# Patient Record
Sex: Male | Born: 1995 | Race: Black or African American | Hispanic: No | Marital: Single | Smoking: Current every day smoker
Health system: Southern US, Community
[De-identification: ages and names within clinical notes are randomized; demographics above are authoritative.]

---
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
## 2016-06-07 ENCOUNTER — Ambulatory Visit (HOSPITAL_COMMUNITY)
Admission: EM | Admit: 2016-06-07 | Discharge: 2016-06-07 | Disposition: A | Discharge status: Nursing Home (Includes ICF) | Payer: Medicaid Other | Attending: Internal Medicine | Admitting: Internal Medicine

## 2016-06-07 ENCOUNTER — Emergency Department (HOSPITAL_COMMUNITY)
Admission: EM | Admit: 2016-06-07 | Discharge: 2016-06-08 | Disposition: A | Discharge status: Home / Self Care | Payer: Self-pay | Attending: Emergency Medicine | Admitting: Emergency Medicine

## 2016-06-07 ENCOUNTER — Encounter (HOSPITAL_COMMUNITY): Payer: Self-pay | Admitting: Emergency Medicine

## 2016-06-07 ENCOUNTER — Emergency Department (HOSPITAL_COMMUNITY): Payer: Medicaid Other

## 2016-06-07 DIAGNOSIS — Y929 Unspecified place or not applicable: Secondary | ICD-10-CM | POA: Insufficient documentation

## 2016-06-07 DIAGNOSIS — R52 Pain, unspecified: Secondary | ICD-10-CM

## 2016-06-07 DIAGNOSIS — S3994XA Unspecified injury of external genitals, initial encounter: Secondary | ICD-10-CM

## 2016-06-07 DIAGNOSIS — Y939 Activity, unspecified: Secondary | ICD-10-CM | POA: Insufficient documentation

## 2016-06-07 DIAGNOSIS — F17210 Nicotine dependence, cigarettes, uncomplicated: Secondary | ICD-10-CM | POA: Insufficient documentation

## 2016-06-07 DIAGNOSIS — F909 Attention-deficit hyperactivity disorder, unspecified type: Secondary | ICD-10-CM | POA: Insufficient documentation

## 2016-06-07 DIAGNOSIS — N5089 Other specified disorders of the male genital organs: Secondary | ICD-10-CM

## 2016-06-07 DIAGNOSIS — Y999 Unspecified external cause status: Secondary | ICD-10-CM | POA: Insufficient documentation

## 2016-06-07 DIAGNOSIS — X58XXXA Exposure to other specified factors, initial encounter: Secondary | ICD-10-CM | POA: Insufficient documentation

## 2016-06-07 DIAGNOSIS — S3022XA Contusion of scrotum and testes, initial encounter: Secondary | ICD-10-CM | POA: Insufficient documentation

## 2016-06-07 LAB — CBC WITH DIFFERENTIAL/PLATELET
BASOS ABS: 0 10*3/uL (ref 0.0–0.1)
BASOS PCT: 0 %
EOS PCT: 0 %
Eosinophils Absolute: 0 10*3/uL (ref 0.0–0.7)
HCT: 39.4 % (ref 39.0–52.0)
Hemoglobin: 13.3 g/dL (ref 13.0–17.0)
Lymphocytes Relative: 15 %
Lymphs Abs: 1.8 10*3/uL (ref 0.7–4.0)
MCH: 29.6 pg (ref 26.0–34.0)
MCHC: 33.8 g/dL (ref 30.0–36.0)
MCV: 87.8 fL (ref 78.0–100.0)
MONO ABS: 0.7 10*3/uL (ref 0.1–1.0)
Monocytes Relative: 6 %
Neutro Abs: 9.7 10*3/uL — ABNORMAL HIGH (ref 1.7–7.7)
Neutrophils Relative %: 79 %
PLATELETS: 365 10*3/uL (ref 150–400)
RBC: 4.49 MIL/uL (ref 4.22–5.81)
RDW: 12.1 % (ref 11.5–15.5)
WBC: 12.2 10*3/uL — ABNORMAL HIGH (ref 4.0–10.5)

## 2016-06-07 LAB — COMPREHENSIVE METABOLIC PANEL
ALBUMIN: 4.3 g/dL (ref 3.5–5.0)
ALT: 21 U/L (ref 17–63)
ANION GAP: 10 (ref 5–15)
AST: 22 U/L (ref 15–41)
Alkaline Phosphatase: 56 U/L (ref 38–126)
BUN: 11 mg/dL (ref 6–20)
CHLORIDE: 105 mmol/L (ref 101–111)
CO2: 24 mmol/L (ref 22–32)
Calcium: 9.8 mg/dL (ref 8.9–10.3)
Creatinine, Ser: 0.96 mg/dL (ref 0.61–1.24)
GFR calc Af Amer: >60 mL/min (ref >60)
GLUCOSE: 82 mg/dL (ref 65–99)
POTASSIUM: 3.5 mmol/L (ref 3.5–5.1)
Sodium: 139 mmol/L (ref 135–145)
Total Bilirubin: 0.7 mg/dL (ref 0.3–1.2)
Total Protein: 7.3 g/dL (ref 6.5–8.1)

## 2016-06-07 MED ORDER — MORPHINE SULFATE (PF) 4 MG/ML IV SOLN: 4.0000 mg | Freq: Once | INTRAVENOUS | Status: DC

## 2016-06-07 MED ORDER — HYDROCODONE-ACETAMINOPHEN 5-325 MG PO TABS
1.0000 | ORAL_TABLET | Freq: Once | ORAL | Status: AC
  Administered 2016-06-07: 1 via ORAL
  Filled 2016-06-07: qty 1

## 2016-06-07 NOTE — ED Notes (Signed)
Pt declined STD check.... Notified Dr. Lum BabeEniola

## 2016-06-07 NOTE — Discharge Instructions (Signed)
It was nice seeing you. I am sorry about your scrotal injury. I will recommend and ultrasound which we don't do here. Please go to the ED right away. Use Tylenol as needed for pain.

## 2016-06-07 NOTE — ED Provider Notes (Signed)
[facility] Provider Note   CSN: [id number] Arrival date & time: 06/07/16  2055  History   Chief Complaint Chief Complaint  Patient presents with  . Groin Swelling   HPI Austin Olson is a 20 y.o. male.   Testicle Pain  This is a new problem. The current episode started more than 2 days ago (4). The problem occurs constantly. The problem has been gradually worsening. Nothing aggravates the symptoms. Nothing relieves the symptoms. He has tried nothing for the symptoms.   History reviewed. No pertinent past medical history.  Patient Active Problem List   Diagnosis Date Noted  . Screen for STD (sexually transmitted disease) 07/25/2014  . Attention deficit hyperactivity disorder (ADHD) 02/04/2010  . 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 ALLERGIC RHINITIS 11/05/2006  . 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 No pertinent surgical history.   Home Medications    Prior to Admission medications   Not on File    Family History Family History  Problem Relation Age of Onset  . Diabetes Father   . Asthma Sister   . Hypertension Neg Hx   . Heart disease Neg Hx    Social History Social History  Substance Use Topics  . Smoking status: Current Every Day Smoker    Types: Cigarettes  . Smokeless tobacco: Never Used  . Alcohol use Yes   Allergies   Patient has no known allergies.  Review of Systems Review of Systems  Constitutional: Negative for fatigue and fever.  Genitourinary: Positive for penile pain, penile swelling, scrotal swelling and testicular pain. Negative for discharge, dysuria, flank pain and genital sores.  All other systems reviewed and are negative.  Physical Exam Updated Vital Signs BP 132/76 (BP Location: Right Arm)   Pulse 100   Temp 98.1 F (36.7 C) (Oral)   Resp 18   Ht 5\' 11"  (1.803 m)   Wt 95.3 kg   SpO2 98%   BMI 29.29 kg/m   Physical Exam  Constitutional: He is oriented to person, place, and time. He appears  well-developed and well-nourished. No distress.  HENT:  Head: Normocephalic.  Eyes: Pupils are equal, round, and reactive to light.  Neck: Normal range of motion.  Cardiovascular: Normal rate.   Pulmonary/Chest: Effort normal. No respiratory distress. He has no wheezes.  Abdominal: Soft. He exhibits no distension. There is no tenderness. There is no guarding.  Genitourinary:  Genitourinary Comments: Scrotal swelling L/R with discoloration.   No testicular tenderness. Cremasteric reflex intact.   No penile tenderness.   Musculoskeletal: Normal range of motion. He exhibits no edema.  Neurological: He is alert and oriented to person, place, and time. He displays normal reflexes. No cranial nerve deficit. He exhibits normal muscle tone. Coordination normal.  Skin: Skin is warm. Capillary refill takes less than 2 seconds. He is not diaphoretic.  Psychiatric: He has a normal mood and affect. His behavior is normal.  Nursing note and vitals reviewed.    ED Treatments / Results  Labs (all labs ordered are listed, but only abnormal results are displayed) Labs Reviewed  CBC WITH DIFFERENTIAL/PLATELET - Abnormal; Notable for the following:       Result Value   WBC 12.2 (*)    Neutro Abs 9.7 (*)    All other components within normal limits  COMPREHENSIVE METABOLIC PANEL  URINALYSIS, ROUTINE W REFLEX MICROSCOPIC (NOT AT [facility])    EKG  EKG Interpretation None       Radiology US Scrotum  Result Date: 06/07/2016  CLINICAL DATA:  Trauma to the penis during intercourse. Penile pain and scrotal swelling. Symptoms began 5 days ago. EXAM: SCROTAL ULTRASOUND DOPPLER ULTRASOUND OF THE TESTICLES TECHNIQUE: Complete ultrasound examination of the testicles, epididymis, and other scrotal structures was performed. Color and spectral Doppler ultrasound were also utilized to evaluate blood flow to the testicles. COMPARISON:  None. FINDINGS: Right testicle Measurements: 4 x 2.2 x 2.8 cm. Normal  testicular parenchymal echotexture. Tiny punctate calcification noted. This is likely of no clinical significance. Left testicle Measurements: 3.4 x 2.1 x 2.9 cm. No mass or microlithiasis visualized. Right epididymis: Mild prominence of the tail of the epididymis. No focal lesions identified. Left epididymis:  Normal in size and appearance. Hydrocele:  None visualized. Varicocele:  None visualized. Pulsed Doppler interrogation of both testes demonstrates normal low resistance arterial and venous waveforms bilaterally. Normal homogeneous and symmetrical flow is demonstrated to both testes on color flow Doppler imaging. Prominent diffuse scrotal soft tissue swelling could indicate subcutaneous edema or hematoma. No focal lesion is visualized. IMPRESSION: Normal sound appearance of the testicles. No evidence of testicular mass, torsion, or inflammation. Diffuse scrotal soft tissue swelling. Electronically Signed   By: Burman NievesWilliam  Stevens M.D.   On: 06/07/2016 23:06   [place] Art/ven Flow Abd Pelv Doppler  Result Date: 06/07/2016 CLINICAL DATA:  Trauma to the penis during intercourse. Penile pain and scrotal swelling. Symptoms began 5 days ago. EXAM: SCROTAL ULTRASOUND DOPPLER ULTRASOUND OF THE TESTICLES TECHNIQUE: Complete ultrasound examination of the testicles, epididymis, and other scrotal structures was performed. Color and spectral Doppler ultrasound were also utilized to evaluate blood flow to the testicles. COMPARISON:  None. FINDINGS: Right testicle Measurements: 4 x 2.2 x 2.8 cm. Normal testicular parenchymal echotexture. Tiny punctate calcification noted. This is likely of no clinical significance. Left testicle Measurements: 3.4 x 2.1 x 2.9 cm. No mass or microlithiasis visualized. Right epididymis: Mild prominence of the tail of the epididymis. No focal lesions identified. Left epididymis:  Normal in size and appearance. Hydrocele:  None visualized. Varicocele:  None visualized. Pulsed Doppler interrogation  of both testes demonstrates normal low resistance arterial and venous waveforms bilaterally. Normal homogeneous and symmetrical flow is demonstrated to both testes on color flow Doppler imaging. Prominent diffuse scrotal soft tissue swelling could indicate subcutaneous edema or hematoma. No focal lesion is visualized. IMPRESSION: Normal sound appearance of the testicles. No evidence of testicular mass, torsion, or inflammation. Diffuse scrotal soft tissue swelling. Electronically Signed   By: Burman NievesWilliam  Stevens M.D.   On: 06/07/2016 23:06    Procedures Procedures (including critical care time)  Medications Ordered in ED Medications  morphine 4 MG/ML injection 4 mg (not administered)     Initial Impression / Assessment and Plan / ED Course  I have reviewed the triage vital signs and the nursing notes.  Pertinent labs & imaging results that were available during my care of the patient were reviewed by me and considered in my medical decision making (see chart for details).  Clinical Course    Patient is a pleasant 20 year old male who presents to emergency department today with concerns of scrotal swelling. Approximate 4 days ago during intercourse, patient had trauma to his penis. He had immediate swelling to his scrotal area that subsequently went to the base of his penis. Since that time patient's pain has slowly improved as well as swelling has decreased. He went to urgent care today who recommended ultrasound.  Physical exam is consistent with hematoma, patient has more likely not had any  fracture of his shaft as he has had intercourse, degeneration and injected without any pain since his initial injury.  Patient has no concerns or signs or symptoms of STDs. He has no dysuria. He has no history of the same.  Patient has nose signs or symptoms associated torsion or epididymitis.  Ultrasound was obtained which showed scrotal swelling but no other additional abnormalities. This is more than  likely hematoma of his scrotum originating at the base of his penis. Patient will follow up with urology for further evaluation and management.  Patient discharged home in good health.  Final Clinical Impressions(s) / ED Diagnoses   Final diagnoses:  Traumatic scrotal hematoma, initial encounter    New Prescriptions New Prescriptions   No medications on file     Deirdre PeerJeremiah Sheridan Gettel, MD 06/08/16 0123    Charlynne Panderavid Hsienta Yao, MD 06/08/16 1511

## 2016-06-07 NOTE — ED Provider Notes (Addendum)
[facility]    CSN: [id number] Arrival date & time: 06/07/16  1938     History   Chief Complaint Chief Complaint  Patient presents with  . Penis Pain    HPI Austin Olson is a 20 y.o. male.   The history is provided by the patient. No language interpreter was used.  Penile injury: During intercourse 5 days ago, she missed the vagina orifice and his penis hit his partner's pelvic bone. Initially he was having pain on standing and walking. Pain is located mostly around his testicle, no penile pain, there is associated swelling around his scrotal area with bruising. No discharge. He uses condoms occasional. He is currently engaged to be married to same partner for 7 months. Denies dysuria,he is able to urinate and ejaculate fine. He has not had sex since then.  History reviewed. No pertinent past medical history.  Patient Active Problem List   Diagnosis Date Noted  . Screen for STD (sexually transmitted disease) 07/25/2014  . Attention deficit hyperactivity disorder (ADHD) 02/04/2010  . 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 ALLERGIC RHINITIS 11/05/2006  . 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 No pertinent surgical history.     Home Medications    Prior to Admission medications   Not on File    Family History Family History  Problem Relation Age of Onset  . Diabetes Father   . Asthma Sister   . Hypertension Neg Hx   . Heart disease Neg Hx     Social History Social History  Substance Use Topics  . Smoking status: Current Every Day Smoker    Types: Cigarettes  . Smokeless tobacco: Never Used  . Alcohol use Yes     Allergies   Patient has no known allergies.   Review of Systems Review of Systems  Constitutional: Negative.   Respiratory: Negative.   Cardiovascular: Negative.   Genitourinary: Positive for scrotal swelling. Negative for difficulty urinating, discharge, enuresis and frequency.       Scrotal pain  All other  systems reviewed and are negative.    Physical Exam Triage Vital Signs ED Triage Vitals  Enc Vitals Group     BP 06/07/16 1957 130/76     Pulse Rate 06/07/16 1957 114     Resp 06/07/16 1957 16     Temp 06/07/16 1957 99.4 F (37.4 C)     Temp Source 06/07/16 1957 Oral     SpO2 06/07/16 1957 100 %     Weight --      Height --      Head Circumference --      Peak Flow --      Pain Score 06/07/16 1955 8     Pain Loc --      Pain Edu? --      Excl. in GC? --    No data found.   Updated Vital Signs BP 130/76 (BP Location: Left Arm)   Pulse 114   Temp 99.4 F (37.4 C) (Oral)   Resp 16   SpO2 100%   Visual Acuity Right Eye Distance:   Left Eye Distance:   Bilateral Distance:    Right Eye Near:   Left Eye Near:    Bilateral Near:     Physical Exam  Constitutional: He appears well-developed and well-nourished. No distress.  Cardiovascular: Normal rate, regular rhythm and normal heart sounds.   No murmur heard. Pulmonary/Chest: Effort normal and breath sounds normal. No respiratory distress. He has no  wheezes.  Abdominal: Soft. He exhibits no distension. There is no tenderness.  Genitourinary: Circumcised.     Nursing note and vitals reviewed.    UC Treatments / Results  Labs (all labs ordered are listed, but only abnormal results are displayed) Labs Reviewed  URINALYSIS, ROUTINE W REFLEX MICROSCOPIC (NOT AT [id number])  URINE CYTOLOGY ANCILLARY ONLY    EKG  EKG Interpretation None       Radiology No results found.  Procedures Procedures (including critical care time)  Medications Ordered in UC Medications - No data to display   Initial Impression / Assessment and Plan / UC Course  I have reviewed the triage vital signs and the nursing notes.  Pertinent labs & imaging results that were available during my care of the patient were reviewed by me and considered in my medical decision making (see chart for details).  Clinical Course     Massive  scrotal swelling. Likely hematocele I recommended scrotal U/S. Patient advised to go to the ED right away for ultrasound and possibly urology evaluation. HIV, RPR, Urine GC/Chlamydia checked given hx of unprotected sexual activity F/U with PCP for routine health care after ED visit. He agreed with plan.  Final Clinical Impressions(s) / UC Diagnoses   Final diagnoses:  None  Scrotal swelling  Injury to scrotum, initial encounter    New Prescriptions New Prescriptions   No medications on file     Doreene ElandKehinde T Anthone Prieur, MD 06/07/16 [id number]    Doreene ElandKehinde T Marquia Costello, MD 06/07/16 2147

## 2016-06-07 NOTE — ED Triage Notes (Addendum)
Few days ago had trauma to the penis during intercourse (a forceful thrust that missed its target and felt like he jammed it). Since then reports pain to the penis that radiates underneath the scrotum and he reports his scrotum is swollen.

## 2016-06-07 NOTE — ED Triage Notes (Signed)
Pt reports pain to shaft of penis onset 3 days associated w/bruising, swelling   Reports he "jammed" penis while having sexual intercourse  Denies urinary sx, or trouble getting an erection  Steady gait... A&O x4... NAD

## 2016-06-07 NOTE — ED Notes (Signed)
Patient unable to provide urine specimen

## 2016-06-08 NOTE — ED Notes (Signed)
Pt given d/c summary with instructions to f/u with urology per EDP. Pt had no questions nor concerns upon d/c. Ambulated to lobby after signing. VSS.

## 2016-06-08 NOTE — ED Notes (Signed)
Pt is aware that a urine sample is needed pt is unable to urinate at this time

## 2016-12-09 ENCOUNTER — Encounter: Payer: Self-pay | Admitting: Family Medicine

## 2016-12-10 ENCOUNTER — Encounter: Payer: Self-pay | Admitting: Family Medicine

## 2017-12-04 ENCOUNTER — Emergency Department (HOSPITAL_BASED_OUTPATIENT_CLINIC_OR_DEPARTMENT_OTHER)
Admission: EM | Admit: 2017-12-04 | Discharge: 2017-12-05 | Disposition: A | Discharge status: Home / Self Care | Payer: Self-pay | Attending: Emergency Medicine | Admitting: Emergency Medicine

## 2017-12-04 ENCOUNTER — Emergency Department (HOSPITAL_BASED_OUTPATIENT_CLINIC_OR_DEPARTMENT_OTHER): Payer: Self-pay

## 2017-12-04 ENCOUNTER — Other Ambulatory Visit: Payer: Self-pay

## 2017-12-04 ENCOUNTER — Encounter (HOSPITAL_BASED_OUTPATIENT_CLINIC_OR_DEPARTMENT_OTHER): Payer: Self-pay | Admitting: *Deleted

## 2017-12-04 DIAGNOSIS — Y929 Unspecified place or not applicable: Secondary | ICD-10-CM | POA: Insufficient documentation

## 2017-12-04 DIAGNOSIS — M549 Dorsalgia, unspecified: Secondary | ICD-10-CM | POA: Insufficient documentation

## 2017-12-04 DIAGNOSIS — Y999 Unspecified external cause status: Secondary | ICD-10-CM | POA: Insufficient documentation

## 2017-12-04 DIAGNOSIS — S139XXA Sprain of joints and ligaments of unspecified parts of neck, initial encounter: Secondary | ICD-10-CM | POA: Insufficient documentation

## 2017-12-04 DIAGNOSIS — Y939 Activity, unspecified: Secondary | ICD-10-CM | POA: Insufficient documentation

## 2017-12-04 DIAGNOSIS — W11XXXA Fall on and from ladder, initial encounter: Secondary | ICD-10-CM | POA: Insufficient documentation

## 2017-12-04 DIAGNOSIS — F17210 Nicotine dependence, cigarettes, uncomplicated: Secondary | ICD-10-CM | POA: Insufficient documentation

## 2017-12-04 MED ORDER — IBUPROFEN 400 MG PO TABS
400.0000 mg | ORAL_TABLET | Freq: Once | ORAL | Status: AC
  Administered 2017-12-04: 400 mg via ORAL
  Filled 2017-12-04: qty 1

## 2017-12-04 NOTE — ED Notes (Signed)
Pt has no obvious trauma, or neuro deficits. Distal CMS intact x4 extremities.

## 2017-12-04 NOTE — ED Triage Notes (Signed)
Pt c/o fall from ladder 10 ft x 10 hr ago c/o neck pain

## 2017-12-04 NOTE — ED Provider Notes (Signed)
[facility] EMERGENCY DEPARTMENT Provider Note   CSN: [id number] Arrival date & time: 12/04/17  2228     History   Chief Complaint Chief Complaint  Patient presents with  . Fall    HPI Austin Olson is a 22 y.o. male.  The history is provided by the patient.  He states that he was on a ladder that fell and he fell with a ladder landing on grass.  He estimates the fall was from about 8 feet.  He landed on his upper back.  He denies loss of consciousness and denies hitting his head.  He is complaining of pain in his intrascapular area and into his neck.  He denies weakness or numbness.  He has been ambulatory.  He rates pain at 8/10.  He denies lower back pain as well as abdominal pain or extremity pain.  History reviewed. No pertinent past medical history.  Patient Active Problem List   Diagnosis Date Noted  . Screen for STD (sexually transmitted disease) 07/25/2014  . Attention deficit hyperactivity disorder (ADHD) 02/04/2010  . 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 ALLERGIC RHINITIS 11/05/2006  . 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 No pertinent surgical history.      Home Medications    Prior to Admission medications   Not on File    Family History Family History  Problem Relation Age of Onset  . Diabetes Father   . Asthma Sister   . Hypertension Neg Hx   . Heart disease Neg Hx     Social History Social History   Tobacco Use  . Smoking status: Current Every Day Smoker    Types: Cigarettes  . Smokeless tobacco: Never Used  Substance Use Topics  . Alcohol use: Yes  . Drug use: Yes    Types: Marijuana     Allergies   Patient has no known allergies.   Review of Systems Review of Systems  All other systems reviewed and are negative.    Physical Exam Updated Vital Signs BP (!) 194/63 (BP Location: Left Arm)   Pulse 100   Temp 99.3 F (37.4 C) (Oral)   Resp 18   Ht  (1.803 m)   Wt 99.8 kg (220 lb)    SpO2 98%   BMI 30.68 kg/m   Physical Exam  Nursing note and vitals reviewed.  22 year old male, resting comfortably and in no acute distress. Vital signs are significant for elevated systolic blood pressure. Oxygen saturation is 98%, which is normal. Head is normocephalic and atraumatic. PERRLA, EOMI. Oropharynx is clear. Neck is immobilized in a stiff cervical collar.  There is moderate tenderness over the cervical spine.  There is no adenopathy or JVD. Back has moderate tenderness in the i upper thoracic spine.  There is no tenderness in the mid or lower thoracic spine and no paraspinal tenderness.  There is no CVA tenderness. Lungs are clear without rales, wheezes, or rhonchi. Chest is nontender. Heart has regular rate and rhythm without murmur. Abdomen is soft, flat, nontender without masses or hepatosplenomegaly and peristalsis is normoactive. Extremities have no cyanosis or edema, full range of motion is present. Skin is warm and dry without rash. Neurologic: Mental status is normal, cranial nerves are intact, there are no motor or sensory deficits.  ED Treatments / Results   Radiology Dg Thoracic Spine W/swimmers  Result Date: 12/05/2017 CLINICAL DATA:  22 year old male with fall and back pain. EXAM: THORACIC SPINE - 3 VIEWS COMPARISON:  None. FINDINGS: There is no evidence of thoracic spine fracture. Alignment is normal. No other significant bone abnormalities are identified. IMPRESSION: Negative. Electronically Signed   By: Elgie Collard M.D.   On: 12/05/2017 00:16   Ct Cervical Spine Wo Contrast  Result Date: 12/05/2017 CLINICAL DATA:  Patient fell off a ladder 10 feet approximately 10 hours ago. EXAM: CT CERVICAL SPINE WITHOUT CONTRAST TECHNIQUE: Multidetector CT imaging of the cervical spine was performed without intravenous contrast. Multiplanar CT image reconstructions were also generated. COMPARISON:  None. FINDINGS: Alignment: Normal. Skull base and vertebrae: No acute  fracture. No primary bone lesion or focal pathologic process. Small limbus vertebra off the anterior superior corner of C6. Soft tissues and spinal canal: No prevertebral fluid or swelling. No visible canal hematoma. Disc levels:  No canal or neural foraminal encroachment. Upper chest: Clear without focal consolidation or dominant mass. Other: None IMPRESSION: No acute cervical spine fracture or posttraumatic listhesis. Electronically Signed   By: Tollie Eth M.D.   On: 12/05/2017 00:21    Procedures Procedures   Medications Ordered in ED Medications  ibuprofen (ADVIL,MOTRIN) tablet 400 mg (has no administration in time range)     Initial Impression / Assessment and Plan / ED Course  I have reviewed the triage vital signs and the nursing notes.  Pertinent imaging results that were available during my care of the patient were reviewed by me and considered in my medical decision making (see chart for details).  Fall from ladder without evidence of serious injury.  However, will check CT of cervical spine and plain x-rays of the thoracic spine.  He is given a dose of ibuprofen for pain.  Old records are reviewed, and he has no relevant past visits.  X-rays and CT scans show no fracture.  He is discharged with prescription for tramadol, told to use over-the-counter analgesics for less severe pain.  Return precautions discussed.  Final Clinical Impressions(s) / ED Diagnoses   Final diagnoses:  Fall from ladder  Neck sprain, initial encounter  Pain, upper back    ED Discharge Orders        Ordered    traMADol (ULTRAM) 50 MG tablet  Every 6 hours PRN     12/05/17 0035       Dione Booze, MD 12/05/17 [phone]

## 2017-12-05 MED ORDER — TRAMADOL HCL 50 MG PO TABS: 50.0000 mg | ORAL_TABLET | Freq: Four times a day (QID) | ORAL | 0 refills | Status: AC | PRN

## 2017-12-05 NOTE — ED Notes (Signed)
Pt teaching provided on medications that may cause drowsiness. Pt instructed not to drive or operate heavy machinery while taking the prescribed medication. Pt verbalized understanding.   

## 2017-12-05 NOTE — Discharge Instructions (Signed)
Apply ice several times a day. Take ibuprofen, naproxen, or acetaminophen for pain.

## 2020-04-30 IMAGING — CT CT CERVICAL SPINE W/O CM
3 of 4 series · 12 of 33 positions shown, 14 images · non-contrast
Comparison: None.

CLINICAL DATA: Patient fell off a ladder 10 feet approximately 10
hours ago.

EXAM:
CT CERVICAL SPINE WITHOUT CONTRAST
TECHNIQUE: Multidetector CT imaging of the cervical spine was performed without
intravenous contrast. Multiplanar CT image reconstructions were also
generated.

[Series 5: sagittal bone · sagittal · 0.28mm/px · 5 of 78 slices shown, 6 images]
[im 26/78  bone]
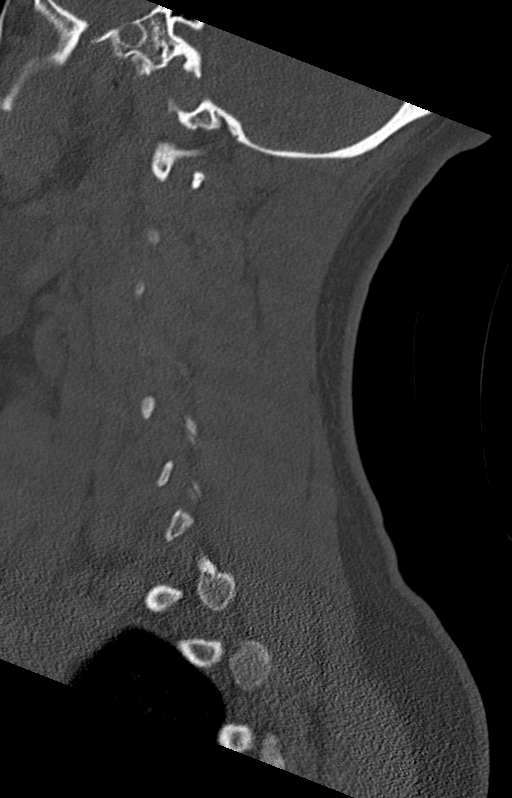
[im 33/78  bone]
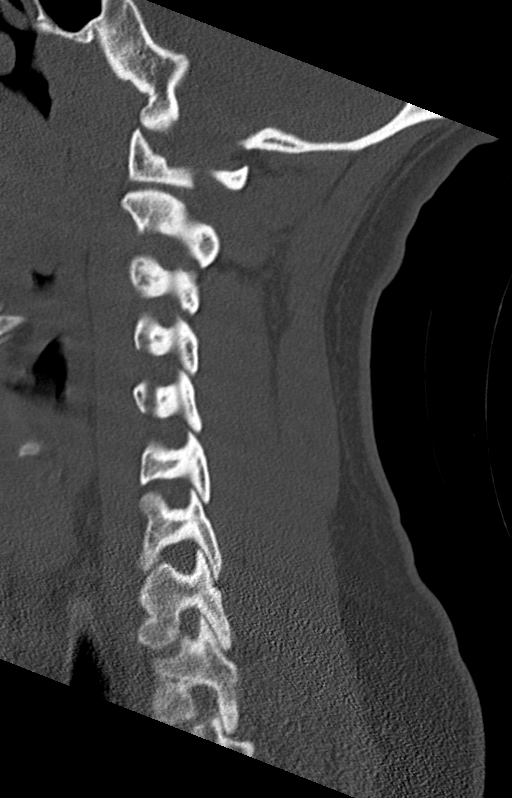
[im 39/78  soft-tissue]
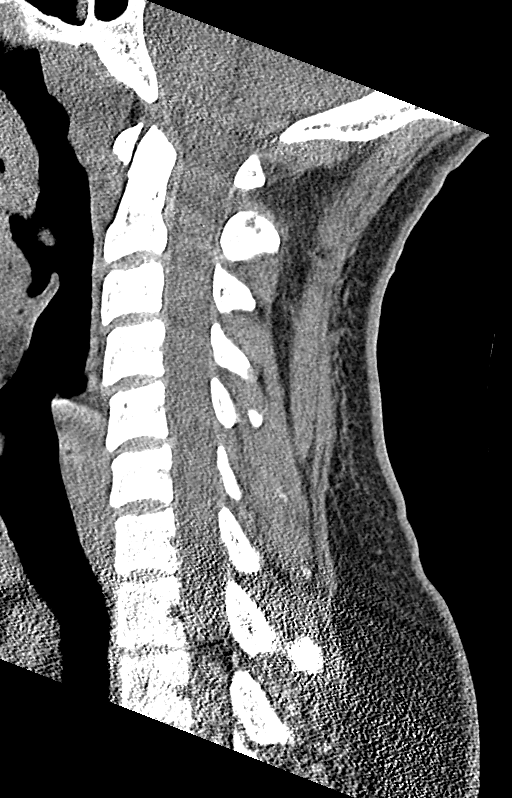
[im 39/78  bone]
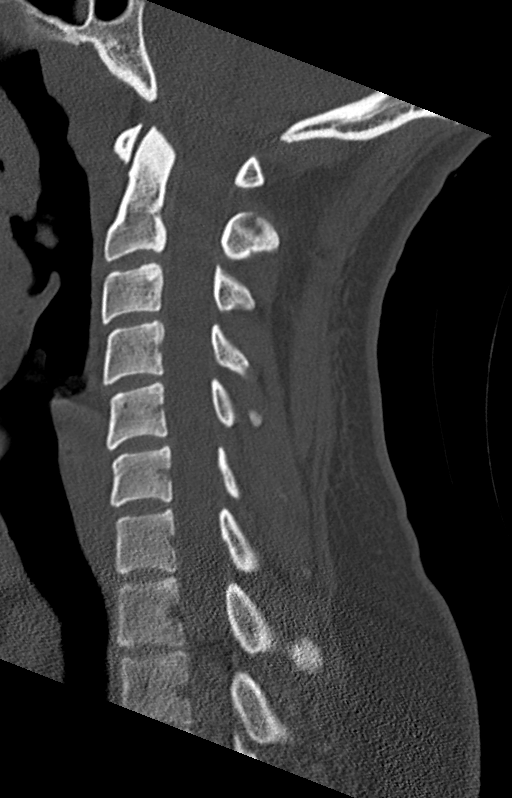
[im 45/78  bone]
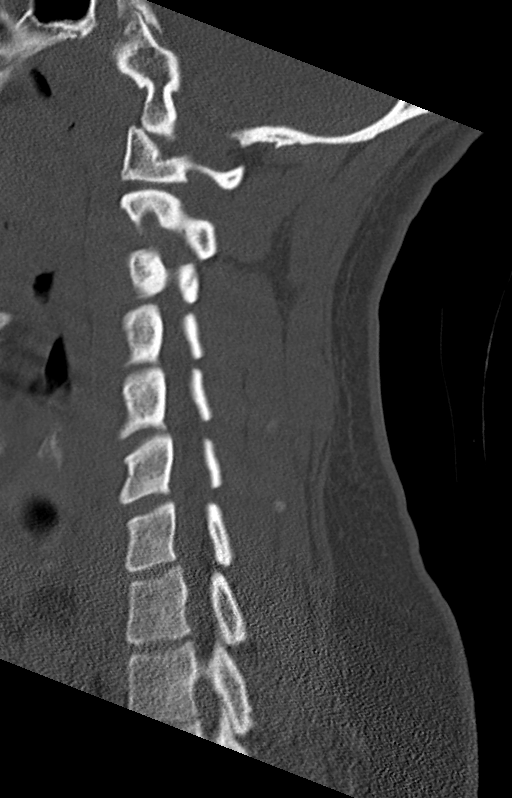
[im 52/78  bone]
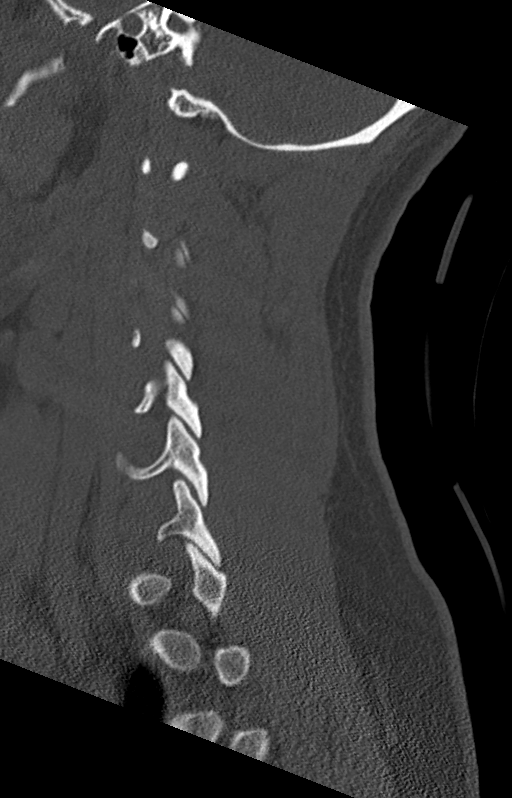

[Series 6: coronal bone · coronal · 0.32mm/px · 3 of 75 slices shown]
[im 18/75  bone]
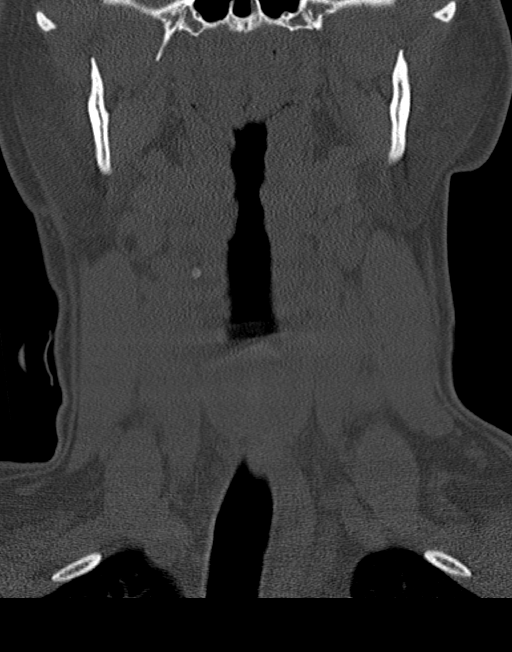
[im 31/75  bone]
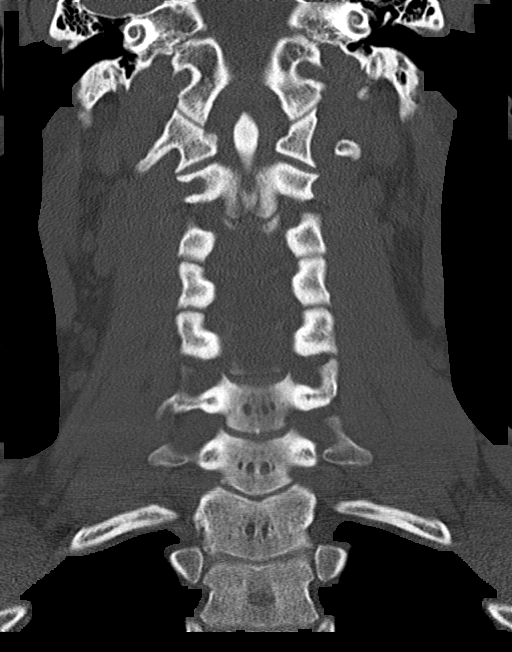
[im 44/75  bone]
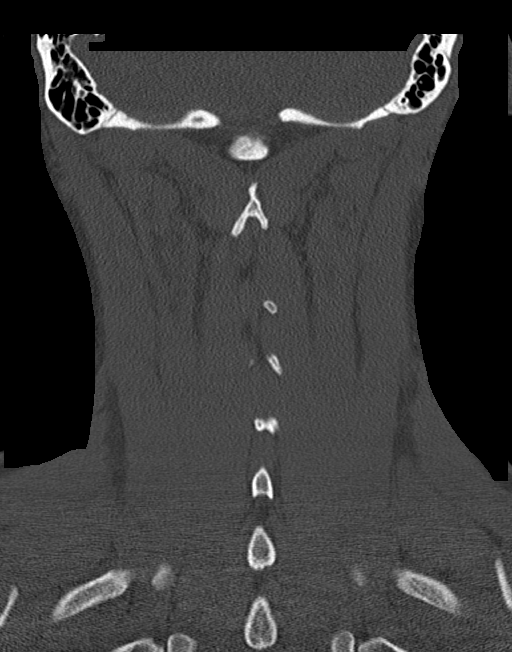

[Series 7: orthogonal bone · axial · 0.26mm/px · z∈[+918,+1043]mm · 4 of 103 slices shown, 5 images]
[im 18/103  soft-tissue]
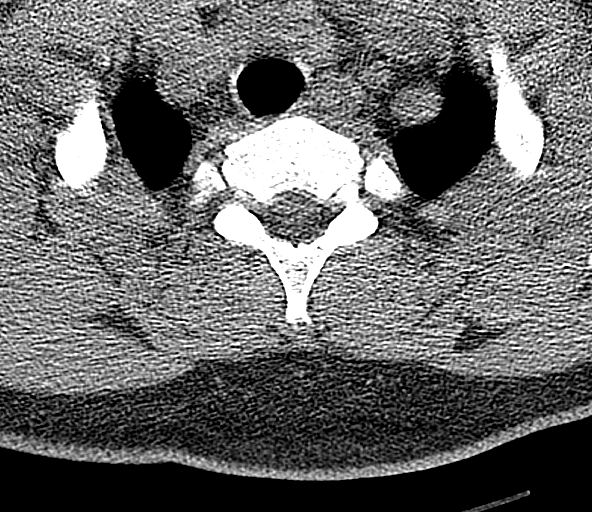
[im 18/103  bone]
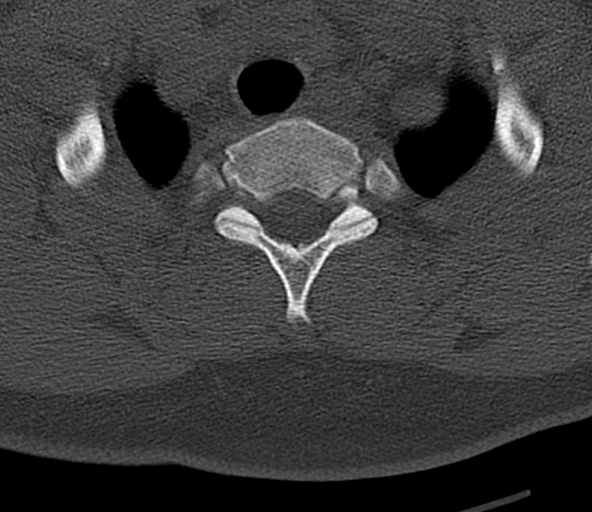
[im 35/103  bone]
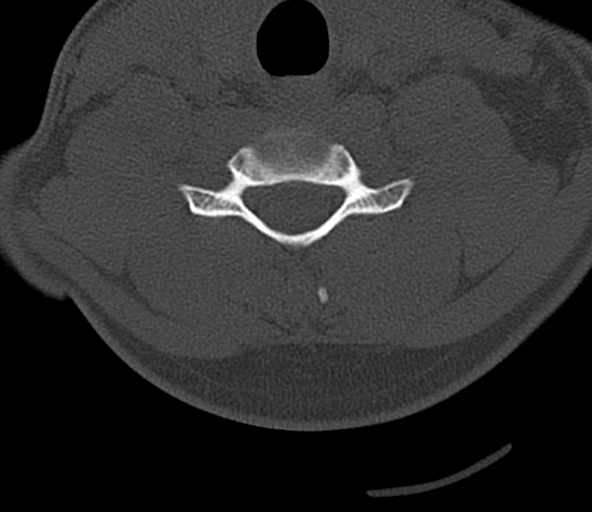
[im 69/103  bone]
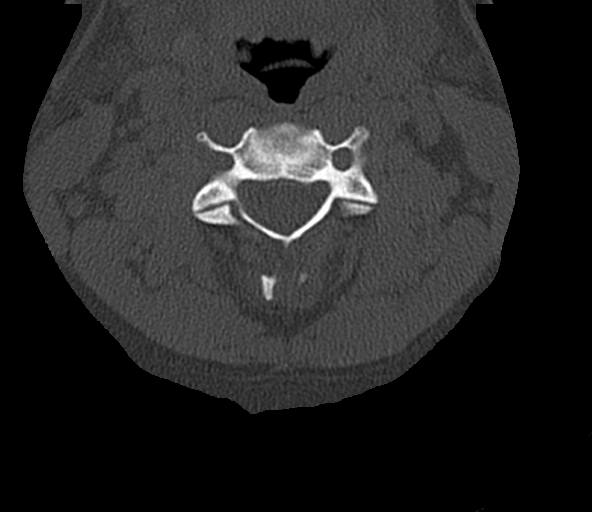
[im 86/103  bone]
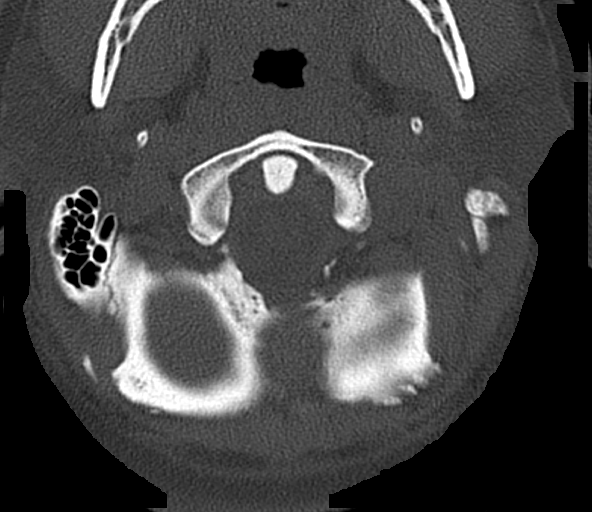

[12 of 33 positions shown; findings below may reference images not displayed]

FINDINGS: Alignment: Normal.

Skull base and vertebrae: No acute fracture. No primary bone lesion
or focal pathologic process. Small limbus vertebra off the anterior
superior corner of C6.

Soft tissues and spinal canal: No prevertebral fluid or swelling. No
visible canal hematoma.

Disc levels:  No canal or neural foraminal encroachment.

Upper chest: Clear without focal consolidation or dominant mass.

Other: None
IMPRESSION: No acute cervical spine fracture or posttraumatic listhesis.
